# Patient Record
Sex: Female | Born: 2000 | Race: White | Hispanic: No | Marital: Single | Smoking: Never smoker
Health system: Southern US, Community
[De-identification: ages and names within clinical notes are randomized; demographics above are authoritative.]

## PROBLEM LIST (undated history)

## (undated) ENCOUNTER — Emergency Department (HOSPITAL_COMMUNITY): Payer: Federal, State, Local not specified - PPO | Source: Home / Self Care

## (undated) DIAGNOSIS — Z789 Other specified health status: Secondary | ICD-10-CM

## (undated) DIAGNOSIS — I7300 Raynaud's syndrome without gangrene: Secondary | ICD-10-CM

## (undated) DIAGNOSIS — J45990 Exercise induced bronchospasm: Secondary | ICD-10-CM

## (undated) DIAGNOSIS — M359 Systemic involvement of connective tissue, unspecified: Secondary | ICD-10-CM

## (undated) HISTORY — DX: Raynaud's syndrome without gangrene: I73.00

## (undated) HISTORY — DX: Other specified health status: Z78.9

## (undated) HISTORY — DX: Exercise induced bronchospasm: J45.990

## (undated) HISTORY — DX: Systemic involvement of connective tissue, unspecified: M35.9

---
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
## 2020-02-16 ENCOUNTER — Encounter: Payer: Self-pay | Admitting: Family Medicine

## 2020-02-16 ENCOUNTER — Other Ambulatory Visit: Payer: Self-pay

## 2020-02-16 ENCOUNTER — Ambulatory Visit (INDEPENDENT_AMBULATORY_CARE_PROVIDER_SITE_OTHER): Payer: Federal, State, Local not specified - PPO | Admitting: Family Medicine

## 2020-02-16 VITALS — BP 86/60 | Temp 98.2°F | Ht 68.0 in | Wt 114.6 lb

## 2020-02-16 DIAGNOSIS — N926 Irregular menstruation, unspecified: Secondary | ICD-10-CM | POA: Diagnosis not present

## 2020-02-16 DIAGNOSIS — Z0000 Encounter for general adult medical examination without abnormal findings: Secondary | ICD-10-CM

## 2020-02-16 DIAGNOSIS — R768 Other specified abnormal immunological findings in serum: Secondary | ICD-10-CM

## 2020-02-16 DIAGNOSIS — Z1159 Encounter for screening for other viral diseases: Secondary | ICD-10-CM

## 2020-02-16 NOTE — Progress Notes (Signed)
Patient: Margaret Cook MRN: [id number] DOB: 2000-05-14 PCP: Orland Mustard, MD     Subjective:  Chief Complaint  Patient presents with  . Annual Exam  . Menstrual Problem  . +ana  . postive mono    HPI: The patient is a 19 y.o. female who presents today for annual exam. She denies any changes to past medical history. There have been no recent hospitalizations. They are following a well balanced diet and exercise plan. She does yoga, walking, and soccer. Weight has been stable. She has concerns about Mono (diagnosed 6 weeks ago). She also has some questions about Birth control, PCOS, irregular cycles and blood work for Lupus.   No family history of breast cancer or colon cancer.   Diagnosed with Mono on 01/17/20 of this year. She overall feels better. She is not in sports or any contact sports. She is dating someone, but they did not get mono. Has questions about exercise.   She does have an irregular period. Was tested for PCOS by gynecologist. She does bleed every month, but she will have periods that can last 2 weeks for she will go 6 weeks until she has a period. They are not heavy. She never had an ultrasound done. She is not on OCP. She has no hx of clots, migraines with aura. Does have ANA and raynauds. Never told she had lupus. She has no symptoms, but mom is wanting to know about this.   Immunization History  Administered Date(s) Administered  . DTaP 05/14/2000, 07/06/2000, 10/24/2000, 06/26/2001, 05/05/2004  . HPV 9-valent 03/04/2019, 05/23/2019  . Hepatitis A 04/01/2002, 03/20/2003  . Hepatitis B 06/13/2000, 09/14/2000, 04/16/2001  . HiB (PRP-OMP) 06/13/2000, 09/14/2000, 04/16/2001, 06/26/2001  . Meningococcal B, OMV 12/19/2017, 01/21/2018  . Meningococcal Mcv4o 08/28/2018  . PFIZER SARS-COV-2 Vaccination 10/19/2019, 11/09/2019  . Tdap 08/27/2018   Colonoscopy: routine screening  Mammogram: routine screening  Pap smear: age 56 years.    Review of Systems   Constitutional: Negative for chills, fatigue and fever.  HENT: Negative for congestion, dental problem, ear pain, hearing loss, sore throat and trouble swallowing.   Eyes: Negative for visual disturbance.  Respiratory: Negative for cough, chest tightness and shortness of breath.   Cardiovascular: Negative for chest pain, palpitations and leg swelling.  Gastrointestinal: Negative for abdominal pain, blood in stool, diarrhea and nausea.  Endocrine: Negative for cold intolerance, polydipsia, polyphagia and polyuria.  Genitourinary: Negative for dysuria and hematuria.  Musculoskeletal: Negative for arthralgias.  Skin: Negative for rash.  Neurological: Negative for dizziness and headaches.  Psychiatric/Behavioral: Negative for dysphoric mood and sleep disturbance. The patient is not nervous/anxious.     Allergies Patient is allergic to eggs or egg-derived products.  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  Surgical History Patient  has a past surgical history that includes Wisdom tooth extraction (2019).  Family History Pateint's family history is not on file.  Social History Patient  reports that she has never smoked. She has never used smokeless tobacco. She reports that she does not drink alcohol and does not use drugs.    Objective: Vitals:   02/16/20 1324  BP: (!) 86/60  Temp: 98.2 F (36.8 C)  TempSrc: Temporal  Weight: 114 lb 9.6 oz (52 kg)  Height: 5\' 8"  (1.727 m)    Body mass index is 17.42 kg/m.  Physical Exam Vitals reviewed.  Constitutional:  Appearance: Normal appearance. She is well-developed, normal weight and well-nourished.  HENT:     Head: Normocephalic and atraumatic.     Right Ear: Tympanic membrane, ear canal and external ear normal.     Left Ear: Tympanic membrane,  ear canal and external ear normal.     Nose: Nose normal.     Mouth/Throat:     Mouth: Oropharynx is clear and moist. Mucous membranes are moist.  Eyes:     Extraocular Movements: EOM normal.     Conjunctiva/sclera: Conjunctivae normal.     Pupils: Pupils are equal, round, and reactive to light.  Neck:     Thyroid: No thyromegaly.  Cardiovascular:     Rate and Rhythm: Normal rate and regular rhythm.     Pulses: Normal pulses and intact distal pulses.     Heart sounds: Normal heart sounds. No murmur heard.   Pulmonary:     Effort: Pulmonary effort is normal.     Breath sounds: Normal breath sounds.  Abdominal:     General: Abdomen is flat. Bowel sounds are normal. There is no distension.     Palpations: Abdomen is soft.     Tenderness: There is no abdominal tenderness.  Musculoskeletal:     Cervical back: Normal range of motion and neck supple.  Lymphadenopathy:     Cervical: Cervical adenopathy (shotty adenopathy ) present.  Skin:    General: Skin is warm and dry.     Findings: No rash.  Neurological:     General: No focal deficit present.     Mental Status: She is alert and oriented to person, place, and time.     Cranial Nerves: No cranial nerve deficit.     Coordination: Coordination normal.     Deep Tendon Reflexes: Reflexes normal.  Psychiatric:        Mood and Affect: Mood and affect and mood normal.        Behavior: Behavior normal.    Flowsheet Row Office Visit from 02/16/2020 in [facility]  PHQ-2 Total Score 0     Urine pregnancy: did not leave sample. Will return.      Assessment/plan: 1. Annual physical exam Routine labs today, she is not fasting. HM reviewed. Needs hep C. Had her covid vaccines, but declines flu. Very fit, continue exercise. Discussed mono and can ease back into exercise. She does not do contact sports.  F/u in one year for annual Patient counseling [x]    Nutrition: Stressed importance of moderation in  sodium/caffeine intake, saturated fat and cholesterol, caloric balance, sufficient intake of fresh fruits, vegetables, fiber, calcium, iron, and 1 mg of folate supplement per day (for females capable of pregnancy).  [x]    Stressed the importance of regular exercise.   [x]    Substance Abuse: Discussed cessation/primary prevention of tobacco, alcohol, or other drug use; driving or other dangerous activities under the influence; availability of treatment for abuse.   [x]    Injury prevention: Discussed safety belts, safety helmets, smoke detector, smoking near bedding or upholstery.   [x]    Sexuality: Discussed sexually transmitted diseases, partner selection, use of condoms, avoidance of unintended pregnancy  and contraceptive alternatives.  [x]    Dental health: Discussed importance of regular tooth brushing, flossing, and dental visits.  [x]    Health maintenance and immunizations reviewed. Please refer to Health maintenance section.    - CBC with Differential/Platelet; Future - Lipid panel; Future - Lipid panel - CBC with Differential/Platelet  2. Encounter for hepatitis C  screening test for low risk patient  - Hepatitis C antibody; Future - Hepatitis C antibody  3. Irregular periods -urine pregnancy and routine labs -pelvic ultrasound ordered, will try to get this done before she goes back to school.  -discussed OCP and we need to make sure her lupus is negative before we start this. Various forms of birth control discussed. On board for ocp if lupus negative. Discussed this in detail.  - TSH; Future - Prolactin; Future - US Pelvic Complete With Transvaginal; Future - POCT urine pregnancy - Prolactin - TSH  4. ANA positive  - ANA, IFA Comprehensive Panel; Future - ANA, IFA Comprehensive Panel    This visit occurred during the SARS-CoV-2 public health emergency.  Safety protocols were in place, including screening questions prior to the visit, additional usage of staff PPE, and  extensive cleaning of exam room while observing appropriate contact time as indicated for disinfecting solutions.    Return in about 1 year (around 02/15/2021) for or sooner if we start ocp .     Orland Mustard, MD [facility]  02/16/2020

## 2020-02-16 NOTE — Patient Instructions (Addendum)
1) checking lupus markers again 2) lots of labs and ultrasound ordered for your irregular period 3) treatment is birth control to regulate your cycles. If you have lupus I can not do estrogen which is what is in most birth control pills.  4) you should be fine to exercise, just ease back into it. 6 weeks for contact sports.    Infectious Mononucleosis Infectious mononucleosis is an infection that is caused by a virus. This illness is often called "mono." It can spread from person to person (is contagious). Mono is usually not serious. It often goes away in 2-4 weeks without treatment. In rare cases, the illness can be bad and last longer. What are the causes? This condition is caused by the Epstein-Barr virus. This virus spreads through:  Contact with a sick person's saliva or other body fluids. This can happen through: ? Kissing. ? Having sex. ? Coughing. ? Sneezing.  Sharing forks, spoons, knives (utensils), or drinking glasses with a person who is sick.  Receiving blood from a person who has mono (blood transfusion).  Receiving an organ from a person who has mono (organ transplant). What increases the risk? You are more likely to develop this condition if:  You are 19-30 years old. What are the signs or symptoms? You may get symptoms 4-6 weeks after infection. Symptoms may start slowly and happen at different times. Common symptoms include:  Sore throat.  Headache.  Being very tired (fatigued).  Pain in the muscles.  Swollen glands.  Fever.  No desire for food.  Rash. Other symptoms include:  A liver or spleen that is larger than normal.  Feeling sick to your stomach (nauseous).  Throwing up (vomiting).  Pain in the belly (abdomen). How is this treated? There is no cure for this condition. Mono usually goes away on its own with time. Treatment can help relieve symptoms and may include:  Taking medicines to treat pain and fever.  Drinking plenty of  fluids.  Getting a lot of rest.  Taking medicines to treat swelling (corticosteroids). In some very bad cases, treatment may have to be given in a hospital. Follow these instructions at home: Medicines  Take over-the-counter and prescription medicines only as told by your doctor.  Do not take ampicillin or amoxicillin. This may cause a rash.  Do not take aspirin if you are under 18. Activity  Rest as needed.  Do not do any of the following activities until your doctor says that they are safe for you: ? Contact sports. You may need to wait at least 1 month before you play sports. ? Exercise that uses a lot of energy. ? Lifting heavy things.  Slowly go back to your normal activities after your fever is gone, or when your doctor says that you can. Be sure to rest when you get tired. General instructions   Avoid kissing or sharing forks, spoons, knives, or drinking cups until your doctor says that you can.  Drink enough fluid to keep your pee (urine) pale yellow.  Do not drink alcohol.  If you have a sore throat: ? Rinse your mouth (gargle) with salt water 3-4 times a day or as needed. To make salt water, dissolve -1 tsp (3-6 g) of salt in 1 cup (237 mL) of warm water. ? Eat soft foods. Cold foods such as ice cream or ice pops can help your throat feel better. ? Try sucking on hard candy.  Wash your hands often with soap and water. If  you cannot use soap and water, use hand sanitizer.  Keep all follow-up visits as told by your doctor. This is important. How is this prevented?   Avoid contact with people who have mono. A person who has mono may not seem sick, but he or she can still spread the virus.  Avoid sharing forks, spoons, knives, drinking cups, or toothbrushes.  Wash your hands often with soap and water. If you cannot use soap and water, use hand sanitizer.  Use the inside of your elbow to cover your mouth when you cough or sneeze. Contact a doctor if:  Your  fever is not gone after 10 days.  You have swelling by your jaw or neck (swollen lymph nodes), and the swelling does not go away after 4 weeks.  Your activity level is not back to normal after 2 months.  Your skin or the white parts of your eyes turn yellow (jaundice).  You have trouble pooping (constipation). This may mean that you: ? Poop (have a bowel movement) fewer times in a week than normal. ? Have a hard time pooping. ? Have poop that is dry, hard, or bigger than normal. Get help right away if:  You have very bad pain in your: ? Belly. ? Shoulder.  You are drooling.  You have trouble swallowing.  You have trouble breathing.  You have a stiff neck.  You have a very bad headache.  You cannot stop throwing up.  You have jerky movements that you cannot control (seizures).  You are mixed up (confused).  You have trouble with balance.  Your nose or gums start to bleed.  You have signs of not having enough water in your body (dehydration). These may include: ? Weakness. ? Sunken eyes. ? Pale skin. ? Dry mouth. ? Fast breathing or heartbeat. Summary  Infectious mononucleosis, or "mono," is an infection that is caused by a virus.  Mono is usually not serious, but some people may need to be treated for it in the hospital.  You should not play contact sports or lift heavy things until your doctor says that you can.  Wash your hands often with soap and water. If you cannot use soap and water, use hand sanitizer. This information is not intended to replace advice given to you by your health care provider. Make sure you discuss any questions you have with your health care provider. Document Revised: 11/28/2017 Document Reviewed: 11/28/2017 Elsevier Patient Education  2020 [place] 98-19 Years Old, Female Preventive care refers to lifestyle choices and visits with your health care provider that can promote health and wellness. At this stage  in your life, you may start seeing a primary care physician instead of a pediatrician. Your health care is now your responsibility. Preventive care for young adults includes:  A yearly physical exam. This is also called an annual wellness visit.  Regular dental and eye exams.  Immunizations.  Screening for certain conditions.  Healthy lifestyle choices, such as diet and exercise. What can I expect for my preventive care visit? Physical exam Your health care provider may check:  Height and weight. These may be used to calculate body mass index (BMI), which is a measurement that tells if you are at a healthy weight.  Heart rate and blood pressure.  Body temperature. Counseling Your health care provider may ask you questions about:  Past medical problems and family medical history.  Alcohol, tobacco, and drug use.  Home and relationship  well-being.  Access to firearms.  Emotional well-being.  Diet, exercise, and sleep habits.  Sexual activity and sexual health.  Method of birth control.  Menstrual cycle.  Pregnancy history. What immunizations do I need?  Influenza (flu) vaccine  This is recommended every year. Tetanus, diphtheria, and pertussis (Tdap) vaccine  You may need a Td booster every 10 years. Varicella (chickenpox) vaccine  You may need this vaccine if you have not already been vaccinated. Human papillomavirus (HPV) vaccine  If recommended by your health care provider, you may need three doses over 6 months. Measles, mumps, and rubella (MMR) vaccine  You may need at least one dose of MMR. You may also need a second dose. Meningococcal conjugate (MenACWY) vaccine  One dose is recommended if you are 60-41 years old and a Market researcher living in a residence hall, or if you have one of several medical conditions. You may also need additional booster doses. Pneumococcal conjugate (PCV13) vaccine  You may need this if you have certain  conditions and were not previously vaccinated. Pneumococcal polysaccharide (PPSV23) vaccine  You may need one or two doses if you smoke cigarettes or if you have certain conditions. Hepatitis A vaccine  You may need this if you have certain conditions or if you travel or work in places where you may be exposed to hepatitis A. Hepatitis B vaccine  You may need this if you have certain conditions or if you travel or work in places where you may be exposed to hepatitis B. Haemophilus influenzae type b (Hib) vaccine  You may need this if you have certain risk factors. You may receive vaccines as individual doses or as more than one vaccine together in one shot (combination vaccines). Talk with your health care provider about the risks and benefits of combination vaccines. What tests do I need? Blood tests  Lipid and cholesterol levels. These may be checked every 5 years starting at age 33.  Hepatitis C test.  Hepatitis B test. Screening  Pelvic exam and Pap test. This may be done every 3 years starting at age 75.  Sexually transmitted disease (STD) testing, if you are at risk.  BRCA-related cancer screening. This may be done if you have a family history of breast, ovarian, tubal, or peritoneal cancers. Other tests  Tuberculosis skin test.  Vision and hearing tests.  Skin exam.  Breast exam. Follow these instructions at home: Eating and drinking   Eat a diet that includes fresh fruits and vegetables, whole grains, lean protein, and low-fat dairy products.  Drink enough fluid to keep your urine pale yellow.  Do not drink alcohol if: ? Your health care provider tells you not to drink. ? You are pregnant, may be pregnant, or are planning to become pregnant. ? You are under the legal drinking age. In the [place] the legal drinking age is 23.  If you drink alcohol: ? Limit how much you have to 0-1 drink a day. ? Be aware of how much alcohol is in your drink. In the [place] one  drink equals one 12 oz bottle of beer (355 mL), one 5 oz glass of wine (148 mL), or one 1 oz glass of hard liquor (44 mL). Lifestyle  Take daily care of your teeth and gums.  Stay active. Exercise at least 30 minutes 5 or more days of the week.  Do not use any products that contain nicotine or tobacco, such as cigarettes, e-cigarettes, and chewing tobacco. If you need help  quitting, ask your health care provider.  Do not use drugs.  If you are sexually active, practice safe sex. Use a condom or other form of birth control (contraception) in order to prevent pregnancy and STIs (sexually transmitted infections). If you plan to become pregnant, see your health care provider for a pre-conception visit.  Find healthy ways to cope with stress, such as: ? Meditation, yoga, or listening to music. ? Journaling. ? Talking to a trusted person. ? Spending time with friends and family. Safety  Always wear your seat belt while driving or riding in a vehicle.  Do not drive if you have been drinking alcohol. Do not ride with someone who has been drinking.  Do not drive when you are tired or distracted. Do not text while driving.  Wear a helmet and other protective equipment during sports activities.  If you have firearms in your house, make sure you follow all gun safety procedures.  Seek help if you have been bullied, physically abused, or sexually abused.  Use the Internet responsibly to avoid dangers such as online bullying and online sex predators. What's next?  Go to your health care provider once a year for a well check visit.  Ask your health care provider how often you should have your eyes and teeth checked.  Stay up to date on all vaccines. This information is not intended to replace advice given to you by your health care provider. Make sure you discuss any questions you have with your health care provider. Document Revised: 02/07/2018 Document Reviewed: 02/07/2018 Elsevier  Patient Education  2020 [employer].

## 2020-02-17 ENCOUNTER — Other Ambulatory Visit: Payer: Federal, State, Local not specified - PPO

## 2020-02-17 LAB — POCT URINE PREGNANCY: Preg Test, Ur: NEGATIVE

## 2020-02-17 NOTE — Addendum Note (Signed)
Addended by: Janeece Riggers D on: 02/17/2020 04:11 PM   Modules accepted: Orders

## 2020-02-18 LAB — LIPID PANEL
Cholesterol: 140 mg/dL (ref <170)
HDL: 71 mg/dL (ref >45)
LDL Cholesterol (Calc): 50 mg/dL (calc) (ref <110)
Non-HDL Cholesterol (Calc): 69 mg/dL (calc) (ref <120)
Total CHOL/HDL Ratio: 2 (calc) (ref <5.0)
Triglycerides: 111 mg/dL — ABNORMAL HIGH (ref <90)

## 2020-02-18 LAB — HEPATITIS C ANTIBODY
Hepatitis C Ab: NONREACTIVE
SIGNAL TO CUT-OFF: 0.02 (ref <1.00)

## 2020-02-18 LAB — ANA, IFA COMPREHENSIVE PANEL
Anti Nuclear Antibody (ANA): POSITIVE — AB
ENA SM Ab Ser-aCnc: <1 AI
SM/RNP: <1 AI
SSA (Ro) (ENA) Antibody, IgG: >8 AI — AB
SSB (La) (ENA) Antibody, IgG: <1 AI
Scleroderma (Scl-70) (ENA) Antibody, IgG: <1 AI
ds DNA Ab: <1 IU/mL

## 2020-02-18 LAB — ANTI-NUCLEAR AB-TITER (ANA TITER): ANA Titer 1: 1:80 {titer} — ABNORMAL HIGH

## 2020-02-18 LAB — CBC WITH DIFFERENTIAL/PLATELET
Absolute Monocytes: 410 cells/uL (ref 200–950)
Basophils Absolute: 20 cells/uL (ref 0–200)
Basophils Relative: 0.4 %
Eosinophils Absolute: 60 cells/uL (ref 15–500)
Eosinophils Relative: 1.2 %
HCT: 39.8 % (ref 35.0–45.0)
Hemoglobin: 13.7 g/dL (ref 11.7–15.5)
Lymphs Abs: 1435 cells/uL (ref 850–3900)
MCH: 31 pg (ref 27.0–33.0)
MCHC: 34.4 g/dL (ref 32.0–36.0)
MCV: 90 fL (ref 80.0–100.0)
MPV: 10.6 fL (ref 7.5–12.5)
Monocytes Relative: 8.2 %
Neutro Abs: 3075 cells/uL (ref 1500–7800)
Neutrophils Relative %: 61.5 %
Platelets: 321 10*3/uL (ref 140–400)
RBC: 4.42 10*6/uL (ref 3.80–5.10)
RDW: 11.9 % (ref 11.0–15.0)
Total Lymphocyte: 28.7 %
WBC: 5 10*3/uL (ref 3.8–10.8)

## 2020-02-18 LAB — PROLACTIN: Prolactin: 7.6 ng/mL

## 2020-02-18 LAB — TSH: TSH: 2.13 mIU/L

## 2020-02-19 ENCOUNTER — Encounter: Payer: Self-pay | Admitting: Family Medicine

## 2020-02-23 MED ORDER — LO LOESTRIN FE 1 MG-10 MCG / 10 MCG PO TABS: 1.0000 | ORAL_TABLET | Freq: Every day | ORAL | 1 refills | Status: DC

## 2020-03-03 ENCOUNTER — Other Ambulatory Visit: Payer: Self-pay | Admitting: Family Medicine

## 2020-03-03 ENCOUNTER — Ambulatory Visit
Admission: RE | Admit: 2020-03-03 | Discharge: 2020-03-03 | Disposition: A | Discharge status: Home / Self Care | Payer: Federal, State, Local not specified - PPO | Source: Ambulatory Visit | Attending: Family Medicine | Admitting: Family Medicine

## 2020-03-03 DIAGNOSIS — N926 Irregular menstruation, unspecified: Secondary | ICD-10-CM

## 2020-03-10 DIAGNOSIS — F411 Generalized anxiety disorder: Secondary | ICD-10-CM | POA: Diagnosis not present

## 2020-03-24 DIAGNOSIS — F411 Generalized anxiety disorder: Secondary | ICD-10-CM | POA: Diagnosis not present

## 2020-04-07 DIAGNOSIS — F411 Generalized anxiety disorder: Secondary | ICD-10-CM | POA: Diagnosis not present

## 2020-04-21 DIAGNOSIS — F411 Generalized anxiety disorder: Secondary | ICD-10-CM | POA: Diagnosis not present

## 2020-05-05 DIAGNOSIS — F411 Generalized anxiety disorder: Secondary | ICD-10-CM | POA: Diagnosis not present

## 2020-05-19 DIAGNOSIS — F411 Generalized anxiety disorder: Secondary | ICD-10-CM | POA: Diagnosis not present

## 2020-05-20 ENCOUNTER — Telehealth: Payer: Self-pay

## 2020-05-20 ENCOUNTER — Other Ambulatory Visit: Payer: Self-pay

## 2020-05-20 ENCOUNTER — Encounter: Payer: Self-pay | Admitting: Family Medicine

## 2020-05-20 MED ORDER — LO LOESTRIN FE 1 MG-10 MCG / 10 MCG PO TABS: 1.0000 | ORAL_TABLET | Freq: Every day | ORAL | 1 refills | Status: DC

## 2020-05-20 NOTE — Telephone Encounter (Signed)
Refill sent to pharmacy.   

## 2020-05-20 NOTE — Telephone Encounter (Signed)
  LAST APPOINTMENT DATE: 02/17/2020   NEXT APPOINTMENT DATE:@Visit  date not found  MEDICATION:Norethindrone-Ethinyl Estradiol-Fe Biphas (LO LOESTRIN FE) 1 MG-10 MCG / 10 MCG tablet  PHARMACY:[facility] (Address: [address], [place], [place] [address])  Molli Knock for refill?  Please advise

## 2020-05-20 NOTE — Telephone Encounter (Signed)
Patient is completely out

## 2020-06-02 DIAGNOSIS — F411 Generalized anxiety disorder: Secondary | ICD-10-CM | POA: Diagnosis not present

## 2020-06-15 ENCOUNTER — Telehealth: Payer: Self-pay

## 2020-06-15 NOTE — Telephone Encounter (Signed)
.   LAST APPOINTMENT DATE: 05/20/2020   NEXT APPOINTMENT DATE:@Visit  date not found  MEDICATION:Norethindrone-Ethinyl Estradiol-Fe Biphas (LO LOESTRIN FE) 1 MG-10 MCG / 10 MCG tablet   PHARMACY:[facility] - [place], [place] - [address]  PATIENT IS REQUESTING A 3 MONTH SUPPLY INSTEAD OF 1 MONTH

## 2020-06-16 MED ORDER — LO LOESTRIN FE 1 MG-10 MCG / 10 MCG PO TABS
1.0000 | ORAL_TABLET | Freq: Every day | ORAL | 1 refills | Status: AC
Start: 2020-06-16 — End: ?

## 2020-06-16 NOTE — Telephone Encounter (Signed)
Lvm for pt to call the office back. 

## 2020-06-16 NOTE — Telephone Encounter (Signed)
Refilled.  Missie Gehrig, MD [facility]   

## 2020-07-27 DIAGNOSIS — L710 Perioral dermatitis: Secondary | ICD-10-CM | POA: Diagnosis not present

## 2021-03-01 DIAGNOSIS — Z113 Encounter for screening for infections with a predominantly sexual mode of transmission: Secondary | ICD-10-CM | POA: Diagnosis not present

## 2021-03-01 DIAGNOSIS — R768 Other specified abnormal immunological findings in serum: Secondary | ICD-10-CM | POA: Diagnosis not present

## 2021-03-01 DIAGNOSIS — Z681 Body mass index (BMI) 19 or less, adult: Secondary | ICD-10-CM | POA: Diagnosis not present

## 2021-03-01 DIAGNOSIS — M359 Systemic involvement of connective tissue, unspecified: Secondary | ICD-10-CM | POA: Diagnosis not present

## 2021-03-01 DIAGNOSIS — I7300 Raynaud's syndrome without gangrene: Secondary | ICD-10-CM | POA: Diagnosis not present

## 2021-03-01 DIAGNOSIS — Z01419 Encounter for gynecological examination (general) (routine) without abnormal findings: Secondary | ICD-10-CM | POA: Diagnosis not present

## 2021-03-01 DIAGNOSIS — R760 Raised antibody titer: Secondary | ICD-10-CM | POA: Diagnosis not present

## 2021-04-16 IMAGING — DX CHEST  1 VIEW
1 series · 1 of 1 positions shown · non-contrast
Comparison: None.

CLINICAL DATA: Positive tuberculin skin test

EXAM:
CHEST  1 VIEW

[chest pa]
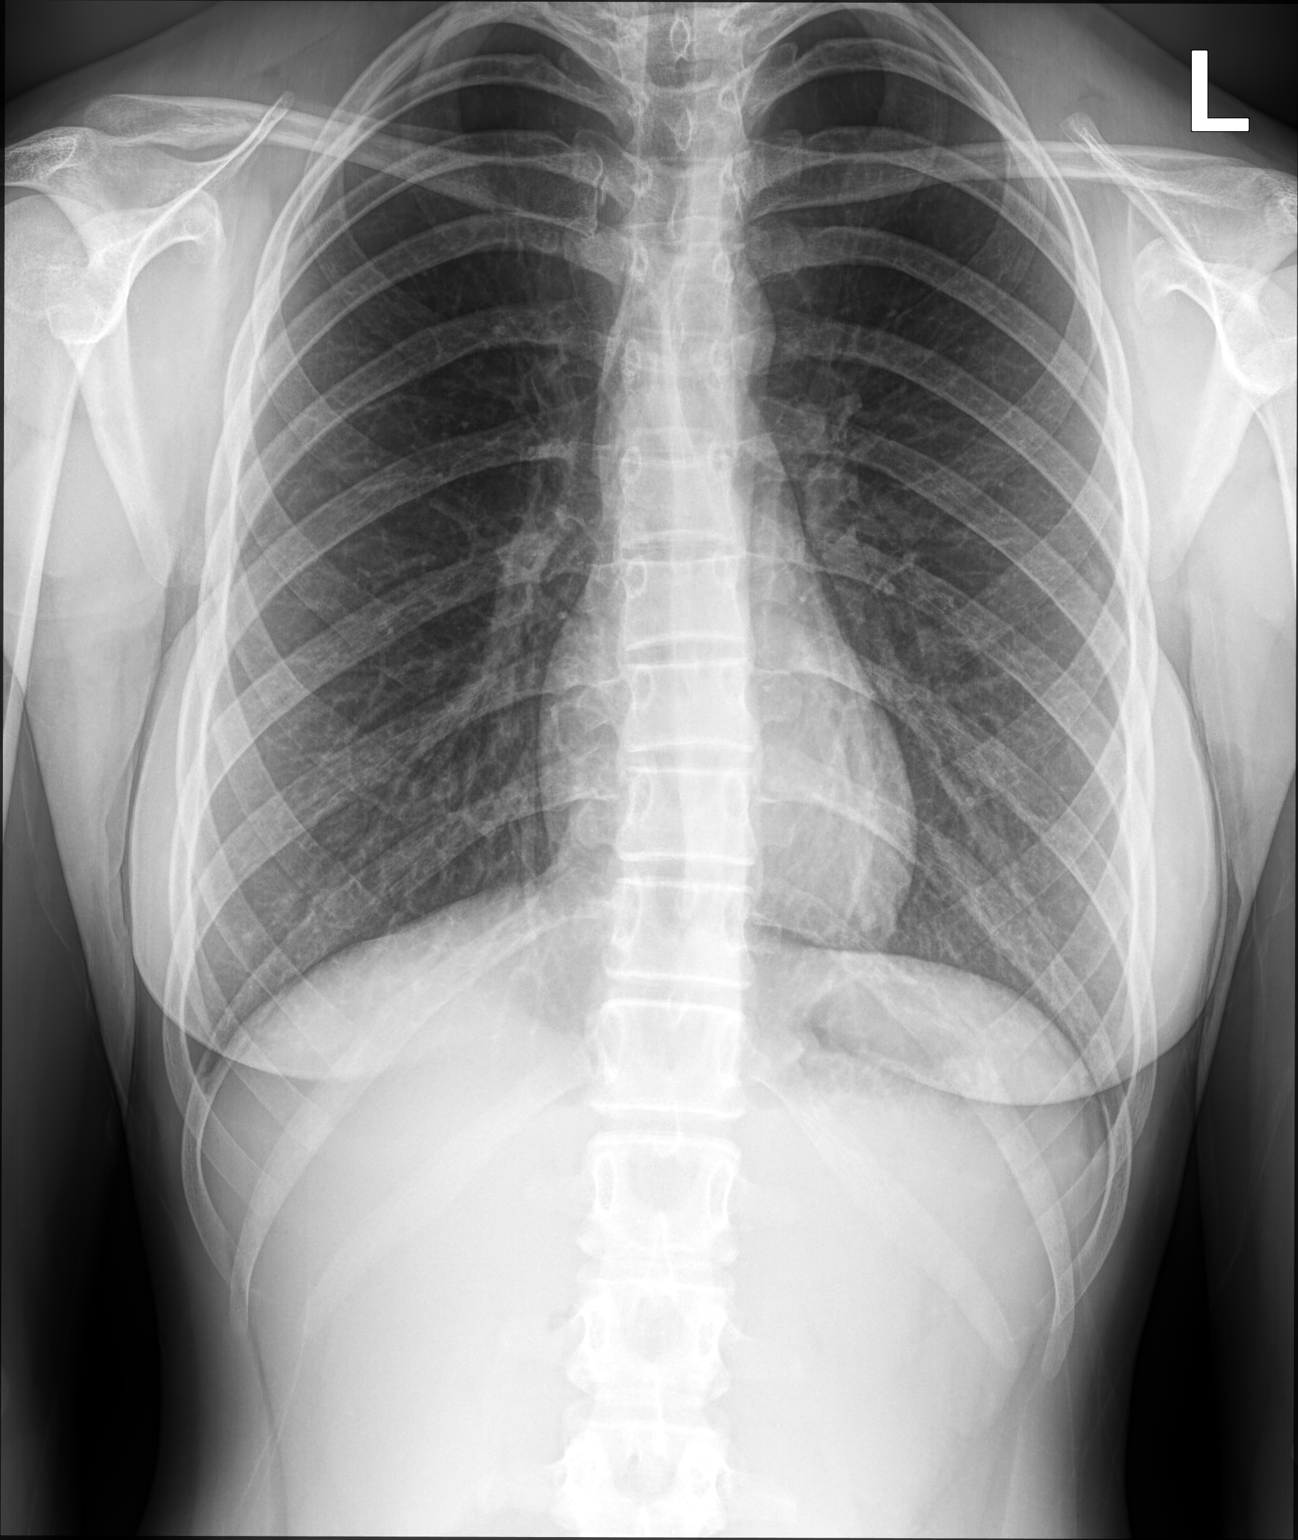

[1 of 1 positions shown; findings below may reference images not displayed]

FINDINGS: Lungs are clear. Heart size and pulmonary vascularity are normal. No
adenopathy. There is lower thoracic levoscoliosis.
IMPRESSION: Lungs clear.  No evident adenopathy.
